# Patient Record
Sex: Male | Born: 1990 | Race: White | Hispanic: Yes | Marital: Single | Smoking: Former smoker
Health system: Southern US, Community
[De-identification: ages and names within clinical notes are randomized; demographics above are authoritative.]

---

## 2011-04-01 ENCOUNTER — Emergency Department (HOSPITAL_COMMUNITY)
Admission: EM | Admit: 2011-04-01 | Discharge: 2011-04-01 | Disposition: A | Discharge status: Home / Self Care | Payer: Self-pay | Attending: Emergency Medicine | Admitting: Emergency Medicine

## 2011-04-01 ENCOUNTER — Emergency Department (HOSPITAL_COMMUNITY): Payer: Self-pay

## 2011-04-01 DIAGNOSIS — R0609 Other forms of dyspnea: Secondary | ICD-10-CM | POA: Insufficient documentation

## 2011-04-01 DIAGNOSIS — R000 Tachycardia, unspecified: Secondary | ICD-10-CM | POA: Insufficient documentation

## 2011-04-01 DIAGNOSIS — R059 Cough, unspecified: Secondary | ICD-10-CM | POA: Insufficient documentation

## 2011-04-01 DIAGNOSIS — R0989 Other specified symptoms and signs involving the circulatory and respiratory systems: Secondary | ICD-10-CM | POA: Insufficient documentation

## 2011-04-01 DIAGNOSIS — R002 Palpitations: Secondary | ICD-10-CM | POA: Insufficient documentation

## 2011-04-01 DIAGNOSIS — R05 Cough: Secondary | ICD-10-CM | POA: Insufficient documentation

## 2011-04-15 ENCOUNTER — Emergency Department (HOSPITAL_COMMUNITY): Payer: Self-pay

## 2011-04-15 ENCOUNTER — Emergency Department (HOSPITAL_COMMUNITY)
Admission: EM | Admit: 2011-04-15 | Discharge: 2011-04-16 | Disposition: A | Discharge status: Home / Self Care | Payer: Self-pay | Attending: Emergency Medicine | Admitting: Emergency Medicine

## 2011-04-15 DIAGNOSIS — R1310 Dysphagia, unspecified: Secondary | ICD-10-CM | POA: Insufficient documentation

## 2012-04-07 ENCOUNTER — Emergency Department (HOSPITAL_COMMUNITY)
Admission: EM | Admit: 2012-04-07 | Discharge: 2012-04-07 | Disposition: A | Discharge status: Home / Self Care | Payer: Self-pay | Attending: Emergency Medicine | Admitting: Emergency Medicine

## 2012-04-07 ENCOUNTER — Encounter (HOSPITAL_COMMUNITY): Payer: Self-pay

## 2012-04-07 DIAGNOSIS — K219 Gastro-esophageal reflux disease without esophagitis: Secondary | ICD-10-CM | POA: Insufficient documentation

## 2012-04-07 MED ORDER — GI COCKTAIL ~~LOC~~
30.0000 mL | Freq: Once | ORAL | Status: AC
  Administered 2012-04-07: 30 mL via ORAL
  Filled 2012-04-07: qty 30

## 2012-04-07 MED ORDER — FAMOTIDINE 20 MG PO TABS: 20.0000 mg | ORAL_TABLET | Freq: Two times a day (BID) | ORAL | Status: DC

## 2012-04-07 MED ORDER — PANTOPRAZOLE SODIUM 40 MG PO TBEC
40.0000 mg | DELAYED_RELEASE_TABLET | Freq: Every day | ORAL | Status: DC
  Administered 2012-04-07: 40 mg via ORAL
  Filled 2012-04-07: qty 1

## 2012-04-07 MED ORDER — SUCRALFATE 1 G PO TABS: 1.0000 g | ORAL_TABLET | Freq: Four times a day (QID) | ORAL | Status: DC

## 2012-04-07 NOTE — ED Notes (Signed)
Pt c/o upper epigastric burning and sore throat after going to bed last pm, states ate prior to bed

## 2012-04-07 NOTE — ED Provider Notes (Signed)
History     CSN: [id number]  Arrival date & time 04/07/12  0503   First MD Initiated Contact with Patient 04/07/12 [phone]      Chief Complaint  Patient presents with  . Abdominal Pain    upper epigastric burning    (Consider location/radiation/quality/duration/timing/severity/associated sxs/prior treatment) Patient is a 21 y.o. male presenting with abdominal pain. The history is provided by the patient.  Abdominal Pain The primary symptoms of the illness include abdominal pain.   patient here complaining of epigastric pain after eating spicy food last night. No vomiting or bloody stools or black stools. Denies any fever. Pain is burning and radiates from his epigastric area food to his mouth. Notes a bitter taste in his mouth. History of same. No medications taken for this prior to arrival. Denies any dyspnea or chest pain  History reviewed. No pertinent past medical history.  History reviewed. No pertinent past surgical history.  No family history on file.  History  Substance Use Topics  . Smoking status: Not on file  . Smokeless tobacco: Not on file  . Alcohol Use: No      Review of Systems  Gastrointestinal: Positive for abdominal pain.  All other systems reviewed and are negative.    Allergies  Review of patient's allergies indicates no known allergies.  Home Medications  No current outpatient prescriptions on file.  BP 122/86  Pulse 62  Temp 98.7 F (37.1 C) (Oral)  Resp 12  SpO2 100%  Physical Exam  Nursing note and vitals reviewed. Constitutional: He is oriented to person, place, and time. He appears well-developed and well-nourished.  Non-toxic appearance. No distress.  HENT:  Head: Normocephalic and atraumatic.  Eyes: Conjunctivae normal, EOM and lids are normal. Pupils are equal, round, and reactive to light.  Neck: Normal range of motion. Neck supple. No tracheal deviation present. No mass present.  Cardiovascular: Normal rate, regular rhythm and  normal heart sounds.  Exam reveals no gallop.   No murmur heard. Pulmonary/Chest: Effort normal and breath sounds normal. No stridor. No respiratory distress. He has no decreased breath sounds. He has no wheezes. He has no rhonchi. He has no rales.  Abdominal: Soft. Normal appearance and bowel sounds are normal. He exhibits no distension. There is tenderness in the epigastric area. There is no rigidity, no rebound, no guarding and no CVA tenderness.  Musculoskeletal: Normal range of motion. He exhibits no edema and no tenderness.  Neurological: He is alert and oriented to person, place, and time. He has normal strength. No cranial nerve deficit or sensory deficit. GCS eye subscore is 4. GCS verbal subscore is 5. GCS motor subscore is 6.  Skin: Skin is warm and dry. No abrasion and no rash noted.  Psychiatric: He has a normal mood and affect. His speech is normal and behavior is normal.    ED Course  Procedures (including critical care time)  Labs Reviewed - No data to display No results found.   No diagnosis found.    MDM   Pt to be tx for gerd.       Toy Baker, MD 04/07/12 [phone]

## 2014-09-10 DIAGNOSIS — R5383 Other fatigue: Secondary | ICD-10-CM | POA: Insufficient documentation

## 2014-09-25 ENCOUNTER — Ambulatory Visit (INDEPENDENT_AMBULATORY_CARE_PROVIDER_SITE_OTHER): Payer: Self-pay | Admitting: Cardiology

## 2014-09-25 ENCOUNTER — Encounter: Payer: Self-pay | Admitting: Cardiology

## 2014-09-25 VITALS — BP 122/70 | HR 58 | Ht 65.0 in | Wt 202.0 lb

## 2014-09-25 DIAGNOSIS — R1310 Dysphagia, unspecified: Secondary | ICD-10-CM

## 2014-09-25 DIAGNOSIS — R079 Chest pain, unspecified: Secondary | ICD-10-CM

## 2014-09-25 DIAGNOSIS — R002 Palpitations: Secondary | ICD-10-CM

## 2014-09-25 LAB — CBC
HCT: 44.8 % (ref 39.0–52.0)
Hemoglobin: 14 g/dL (ref 13.0–17.0)
MCHC: 31.2 g/dL (ref 30.0–36.0)
MCV: 70.7 fl — ABNORMAL LOW (ref 78.0–100.0)
Platelets: 329 10*3/uL (ref 150.0–400.0)
RBC: 6.34 Mil/uL — ABNORMAL HIGH (ref 4.22–5.81)
RDW: 14.7 % (ref 11.5–15.5)
WBC: 7.9 10*3/uL (ref 4.0–10.5)

## 2014-09-25 LAB — BASIC METABOLIC PANEL
BUN: 15 mg/dL (ref 6–23)
CO2: 26 mEq/L (ref 19–32)
Calcium: 10.2 mg/dL (ref 8.4–10.5)
Chloride: 104 mEq/L (ref 96–112)
Creatinine, Ser: 0.9 mg/dL (ref 0.40–1.50)
GFR: 109.09 mL/min (ref >60.00)
Glucose, Bld: 88 mg/dL (ref 70–99)
Potassium: 4.1 mEq/L (ref 3.5–5.1)
Sodium: 137 mEq/L (ref 135–145)

## 2014-09-25 LAB — HEPATIC FUNCTION PANEL
ALT: 24 U/L (ref 0–53)
AST: 24 U/L (ref 0–37)
Albumin: 5 g/dL (ref 3.5–5.2)
Alkaline Phosphatase: 47 U/L (ref 39–117)
Bilirubin, Direct: 0.1 mg/dL (ref 0.0–0.3)
Total Bilirubin: 0.8 mg/dL (ref 0.2–1.2)
Total Protein: 8.4 g/dL — ABNORMAL HIGH (ref 6.0–8.3)

## 2014-09-25 LAB — TSH: TSH: 0.72 u[IU]/mL (ref 0.35–4.50)

## 2014-09-25 LAB — LIPID PANEL
Cholesterol: 147 mg/dL (ref 0–200)
HDL: 39.6 mg/dL (ref >39.00)
LDL Cholesterol: 78 mg/dL (ref 0–99)
NonHDL: 107.4
Total CHOL/HDL Ratio: 4
Triglycerides: 149 mg/dL (ref 0.0–149.0)
VLDL: 29.8 mg/dL (ref 0.0–40.0)

## 2014-09-25 NOTE — Progress Notes (Signed)
Patient ID: Avenir Lozinski, male   DOB: 27-Jan-1991, 24 y.o.   MRN: [id number]     Patient Name: Jeffery Barnett Date of Encounter: 09/25/2014  Primary Care Provider:  No primary care provider on file. Primary Cardiologist:  Lars Masson   Problem List   History reviewed. No pertinent past medical history. History reviewed. No pertinent past surgical history.  Allergies  No Known Allergies  HPI  A very pleasant 24 year old gentleman who is coming with complaint of chest pain, palpitations, dysphagia and odynophagia. The patient states that about 3 years ago he started to have exertional chest pains that are associated with shortness of breath and sometimes dizziness. He's also experiencing palpitations that can last up to 15 minutes and can happen as frequently as 3 times a week and they're associated with dizziness and nausea. He is also experiencing epigastric and left upper quadrant pain that persist at rest as well. He states that food intake sometimes makes it worse but not all the time. The patient also complains of constant fullness in his throat and difficulty swallowing he can easily choke a larger pieces of meals would lead him to eat very small food, despite these he has difficulty swallowing and experiences pain with that. He denies any syncope or lower extremity edema orthopnea or proximal nocturnal dyspnea. He is no family history of premature coronary artery disease or heart failure. As a child he was never told that he has any murmur or other heart problem.  Home Medications  Prior to Admission medications   Not on File    Family History  Family History  Problem Relation Age of Onset  . Anemia Neg Hx   . Arrhythmia Neg Hx   . Asthma Neg Hx   . Clotting disorder Neg Hx   . Fainting Neg Hx   . Heart attack Neg Hx   . Heart disease Neg Hx   . Heart failure Neg Hx   . Hyperlipidemia Neg Hx   . Hypertension Neg Hx     Social History  History   Social History    . Marital Status: Single    Spouse Name: N/A  . Number of Children: N/A  . Years of Education: N/A   Occupational History  . Not on file.   Social History Main Topics  . Smoking status: Never Smoker   . Smokeless tobacco: Never Used  . Alcohol Use: No  . Drug Use: No  . Sexual Activity: Not on file   Other Topics Concern  . Not on file   Social History Narrative     Review of Systems, as per HPI, otherwise negative General:  No chills, fever, night sweats or weight changes.  Cardiovascular:  No chest pain, dyspnea on exertion, edema, orthopnea, palpitations, paroxysmal nocturnal dyspnea. Dermatological: No rash, lesions/masses Respiratory: No cough, dyspnea Urologic: No hematuria, dysuria Abdominal:   No nausea, vomiting, diarrhea, bright red blood per rectum, melena, or hematemesis Neurologic:  No visual changes, wkns, changes in mental status. All other systems reviewed and are otherwise negative except as noted above.  Physical Exam  Blood pressure 122/70, pulse 58, height  (1.651 m), weight 202 lb (91.627 kg).  General: Pleasant, NAD Psych: Normal affect. Neuro: Alert and oriented X 3. Moves all extremities spontaneously. HEENT: Normal  Neck: Supple without bruits or JVD. Lungs:  Resp regular and unlabored, CTA. Heart: RRR no s3, s4, or murmurs. Abdomen: Soft, non-tender, non-distended, BS + x 4.  Extremities: No clubbing,  cyanosis or edema. DP/PT/Radials 2+ and equal bilaterally.  Labs:  No results for input(s): CKTOTAL, CKMB, TROPONINI in the last 72 hours. No results found for: WBC, HGB, HCT, MCV, PLT  No results found for: DDIMER Invalid input(s): POCBNP No results found for: NA, K, CL, CO2, GLUCOSE, BUN, CREATININE, CALCIUM, PROT, ALBUMIN, AST, ALT, ALKPHOS, BILITOT, GFRNONAA, GFRAA No results found for: CHOL, HDL, LDLCALC, TRIG  Accessory Clinical Findings  echocardiogram  ECG - sinus bradycardia, incomplete right bundle branch block,  rightward axis.    Assessment & Plan  24 year old gentleman  1. Exertional chest pain  - with some typical and some atypical features, we will schedule a treadmill stress test to rule out ischemia. We will also order an echocardiogram to evaluate for any possible structural heart disease.  2. Palpitations associated nausea and dizziness - start 48 hour Holter monitor, check compressive metabolic profile BC and TSH.  3. Dysphagia and odynophagia epigastric pain, patient was started on Nexium without any relief. His presentation is suspicious for upper GI abnormalities such as Schatzki's ring we will refer to GI for further evaluation.  Follow up in one month.   Lars MassonNELSON, Trysten Bernard H, MD, [facility] 09/25/2014, 10:11 AM

## 2014-09-25 NOTE — Patient Instructions (Signed)
Your physician recommends that you have lab work today: BMP, CBC, TSH, LIVER and LIPIDS  Your physician has requested that you have an echocardiogram. Echocardiography is a painless test that uses sound waves to create images of your heart. It provides your doctor with information about the size and shape of your heart and how well your heart's chambers and valves are working. This procedure takes approximately one hour. There are no restrictions for this procedure.  Your physician has requested that you have an exercise tolerance test. For further information please visit [email or web address]. Please also follow instruction sheet, as given.  Your physician has recommended that you wear a  48 hour holter monitor. Holter monitors are medical devices that record the heart's electrical activity. Doctors most often use these monitors to diagnose arrhythmias. Arrhythmias are problems with the speed or rhythm of the heartbeat. The monitor is a small, portable device. You can wear one while you do your normal daily activities. This is usually used to diagnose what is causing palpitations/syncope (passing out).  You have been referred to Gastroenterology for difficulty swallowing.   Your physician recommends that you schedule a follow-up appointment in: 1 MONTH with Dr Delton SeeNelson

## 2014-10-05 ENCOUNTER — Encounter: Payer: Self-pay | Admitting: *Deleted

## 2014-10-05 ENCOUNTER — Other Ambulatory Visit (HOSPITAL_COMMUNITY): Payer: Self-pay

## 2014-10-05 ENCOUNTER — Ambulatory Visit (HOSPITAL_COMMUNITY): Payer: PRIVATE HEALTH INSURANCE | Attending: Cardiology | Admitting: Radiology

## 2014-10-05 ENCOUNTER — Encounter (INDEPENDENT_AMBULATORY_CARE_PROVIDER_SITE_OTHER): Payer: PRIVATE HEALTH INSURANCE

## 2014-10-05 DIAGNOSIS — E669 Obesity, unspecified: Secondary | ICD-10-CM | POA: Insufficient documentation

## 2014-10-05 DIAGNOSIS — R002 Palpitations: Secondary | ICD-10-CM

## 2014-10-05 DIAGNOSIS — R079 Chest pain, unspecified: Secondary | ICD-10-CM

## 2014-10-05 NOTE — Progress Notes (Signed)
Echocardiogram performed.  

## 2014-10-05 NOTE — Progress Notes (Signed)
Patient ID: Jeffery Barnett, male   DOB: 11/23/1990, 24 y.o.   MRN: [id number] [employer] 48 hour holter monitor applied to patient.

## 2014-10-09 ENCOUNTER — Ambulatory Visit: Payer: Self-pay | Admitting: Physician Assistant

## 2014-10-12 ENCOUNTER — Telehealth: Payer: Self-pay | Admitting: *Deleted

## 2014-10-12 NOTE — Telephone Encounter (Signed)
Pt notified of normal 24 hour holter monitor results per Dr Nelson.  Pt verbalized understanding. 

## 2014-10-18 ENCOUNTER — Encounter: Payer: Self-pay | Admitting: Gastroenterology

## 2014-10-18 ENCOUNTER — Ambulatory Visit (INDEPENDENT_AMBULATORY_CARE_PROVIDER_SITE_OTHER): Payer: PRIVATE HEALTH INSURANCE | Admitting: Gastroenterology

## 2014-10-18 VITALS — BP 128/70 | HR 76 | Ht 65.0 in | Wt 200.6 lb

## 2014-10-18 DIAGNOSIS — K219 Gastro-esophageal reflux disease without esophagitis: Secondary | ICD-10-CM | POA: Diagnosis not present

## 2014-10-18 DIAGNOSIS — R1314 Dysphagia, pharyngoesophageal phase: Secondary | ICD-10-CM | POA: Diagnosis not present

## 2014-10-18 MED ORDER — PANTOPRAZOLE SODIUM 40 MG PO TBEC: 40.0000 mg | DELAYED_RELEASE_TABLET | Freq: Every day | ORAL | Status: DC

## 2014-10-18 NOTE — Progress Notes (Signed)
     10/18/2014 Jeffery Barnett [id number] 05/10/1991   HISTORY OF PRESENT ILLNESS:  This is a pleasant 24 year old male who his new to our practice.  He presents to our office today with complaints of dysphagia/difficulty swallowing.  This has been occuring intermittently over the past 4 years or so, but worse again recently.  He says that when he eats sometimes it feels like it gets stuck in his "throat"; he point on the left side of his neck.  This has been occuring again over the past 6-12 months so has finally come for evaluation.  He admits to some nausea at times and some heartburn/reflux as well.  Took some OTC PPI for a short time.  He was in the ED in 2012 for this issue and had a neck/soft tissue x-ray done at that time, which was negative, and esphagram was recommended to follow-up on his symptoms, but he never had that performed.  Recent CBC, TSH, BMP, and hepatic function panel were normal.   History reviewed. No pertinent past medical history. History reviewed. No pertinent past surgical history.  reports that he has quit smoking. He has never used smokeless tobacco. He reports that he drinks alcohol. He reports that he does not use illicit drugs. family history is negative for Anemia, Arrhythmia, Asthma, Clotting disorder, Fainting, Heart attack, Heart disease, Heart failure, Hyperlipidemia, and Hypertension. No Known Allergies    Outpatient Encounter Prescriptions as of 10/18/2014  Medication Sig  . pantoprazole (PROTONIX) 40 MG tablet Take 1 tablet (40 mg total) by mouth daily.     REVIEW OF SYSTEMS  : All other systems reviewed and negative except where noted in the History of Present Illness.   PHYSICAL EXAM: BP 128/70 mmHg  Pulse 76  Ht 5\' 5"  (1.651 m)  Wt 200 lb 9.6 oz (90.992 kg)  BMI 33.38 kg/m2 General: Well developed male in no acute distress Head: Normocephalic and atraumatic Eyes:  Sclerae anicteric, conjunctiva pink. Ears: Normal auditory acuity Lungs:  Clear throughout to auscultation Heart: Regular rate and rhythm Abdomen: Soft, non-distended.  Normal bowel sounds.  Non-tender. Musculoskeletal: Symmetrical with no gross deformities  Skin: No lesions on visible extremities Extremities: No edema  Neurological: Alert oriented x 4, grossly non-focal Psychological:  Alert and cooperative. Normal mood and affect  ASSESSMENT AND PLAN: -Dysphagia:  Intermittent over the past 4 years, but worse again recently.  Does not sound classically like a stricture/Shatzki's ring.  ? Zenker's diverticulum vs other etiology as well.  Will start by checking an esophagram.  Will also start him on pantoprazole 40 mg daily for reflux/GERD related cause of his symptoms.  CC:  No ref. provider found

## 2014-10-18 NOTE — Patient Instructions (Signed)
You have [facility] on 11/01/14 at 11 am. Please arrive 15 minutes prior to your appointment for registration. Make certain not to have anything to eat or drink 6 hours prior to your test. If you need to reschedule for any reason, please contact radiology at [phone] to do so. __________________________________________________________________ A barium swallow is an examination that concentrates on views of the esophagus. This tends to be a double contrast exam (barium and two liquids which, when combined, create a gas to distend the wall of the oesophagus) or single contrast (non-ionic iodine based). The study is usually tailored to your symptoms so a good history is essential. Attention is paid during the study to the form, structure and configuration of the esophagus, looking for functional disorders (such as aspiration, dysphagia, achalasia, motility and reflux) EXAMINATION You may be asked to change into a gown, depending on the type of swallow being performed. A radiologist and radiographer will perform the procedure. The radiologist will advise you of the type of contrast selected for your procedure and direct you during the exam. You will be asked to stand, sit or lie in several different positions and to hold a small amount of fluid in your mouth before being asked to swallow while the imaging is performed .In some instances you may be asked to swallow barium coated marshmallows to assess the motility of a solid food bolus. The exam can be recorded as a digital or video fluoroscopy procedure. POST PROCEDURE It will take 1-2 days for the barium to pass through your system. To facilitate this, it is important, unless otherwise directed, to increase your fluids for the next 24-48hrs and to resume your normal diet.  This test typically takes about 30 minutes to  perform. __________________________________________________________________________________ We have sent medications to your pharmacy for you to pick up at your convenience.

## 2014-10-18 NOTE — Progress Notes (Signed)
Reviewed and agree with management plan.  Malcolm T. Stark, MD FACG 

## 2014-11-01 ENCOUNTER — Ambulatory Visit (HOSPITAL_COMMUNITY)
Admission: RE | Admit: 2014-11-01 | Discharge: 2014-11-01 | Disposition: A | Discharge status: Home / Self Care | Payer: PRIVATE HEALTH INSURANCE | Source: Ambulatory Visit | Attending: Gastroenterology | Admitting: Gastroenterology

## 2014-11-01 DIAGNOSIS — R1314 Dysphagia, pharyngoesophageal phase: Secondary | ICD-10-CM

## 2014-11-01 DIAGNOSIS — R1310 Dysphagia, unspecified: Secondary | ICD-10-CM | POA: Insufficient documentation

## 2014-11-06 ENCOUNTER — Encounter: Payer: Self-pay | Admitting: Physician Assistant

## 2014-11-06 ENCOUNTER — Encounter: Payer: Self-pay | Admitting: *Deleted

## 2014-11-06 NOTE — Progress Notes (Signed)
Patient ID: Jeffery Barnett, male   DOB: 1990/10/12, 24 y.o.   MRN: [id number] Patient did not show up for 11/06/14 2:00 PM appointment for his GXT.

## 2014-11-07 ENCOUNTER — Ambulatory Visit: Payer: Self-pay | Admitting: Cardiology

## 2014-11-08 ENCOUNTER — Encounter: Payer: Self-pay | Admitting: Cardiology

## 2014-11-27 ENCOUNTER — Ambulatory Visit: Payer: Self-pay | Admitting: Cardiology

## 2014-12-07 ENCOUNTER — Encounter: Payer: Self-pay | Admitting: Cardiology

## 2018-02-13 ENCOUNTER — Emergency Department (HOSPITAL_COMMUNITY): Payer: 59

## 2018-02-13 ENCOUNTER — Other Ambulatory Visit: Payer: Self-pay

## 2018-02-13 ENCOUNTER — Emergency Department (HOSPITAL_COMMUNITY)
Admission: EM | Admit: 2018-02-13 | Discharge: 2018-02-13 | Disposition: A | Discharge status: Home / Self Care | Payer: 59 | Attending: Emergency Medicine | Admitting: Emergency Medicine

## 2018-02-13 ENCOUNTER — Encounter (HOSPITAL_COMMUNITY): Payer: Self-pay

## 2018-02-13 DIAGNOSIS — R0789 Other chest pain: Secondary | ICD-10-CM | POA: Insufficient documentation

## 2018-02-13 DIAGNOSIS — R079 Chest pain, unspecified: Secondary | ICD-10-CM | POA: Diagnosis present

## 2018-02-13 DIAGNOSIS — Z87891 Personal history of nicotine dependence: Secondary | ICD-10-CM | POA: Insufficient documentation

## 2018-02-13 DIAGNOSIS — Z79899 Other long term (current) drug therapy: Secondary | ICD-10-CM | POA: Insufficient documentation

## 2018-02-13 LAB — CBC
HCT: 44.3 % (ref 39.0–52.0)
HEMOGLOBIN: 14.2 g/dL (ref 13.0–17.0)
MCH: 22.9 pg — ABNORMAL LOW (ref 26.0–34.0)
MCHC: 32.1 g/dL (ref 30.0–36.0)
MCV: 71.6 fL — ABNORMAL LOW (ref 78.0–100.0)
Platelets: 342 10*3/uL (ref 150–400)
RBC: 6.19 MIL/uL — AB (ref 4.22–5.81)
RDW: 14.2 % (ref 11.5–15.5)
WBC: 8.2 10*3/uL (ref 4.0–10.5)

## 2018-02-13 LAB — BASIC METABOLIC PANEL
ANION GAP: 8 (ref 5–15)
BUN: 14 mg/dL (ref 6–20)
CO2: 28 mmol/L (ref 22–32)
Calcium: 9.9 mg/dL (ref 8.9–10.3)
Chloride: 107 mmol/L (ref 98–111)
Creatinine, Ser: 0.8 mg/dL (ref 0.61–1.24)
Glucose, Bld: 99 mg/dL (ref 70–99)
Potassium: 4 mmol/L (ref 3.5–5.1)
Sodium: 143 mmol/L (ref 135–145)

## 2018-02-13 LAB — I-STAT TROPONIN, ED: TROPONIN I, POC: 0 ng/mL (ref 0.00–0.08)

## 2018-02-13 MED ORDER — PANTOPRAZOLE SODIUM 20 MG PO TBEC: 20.0000 mg | DELAYED_RELEASE_TABLET | Freq: Every day | ORAL | 1 refills | Status: AC

## 2018-02-13 NOTE — ED Notes (Signed)
Patient verbalized understanding of discharge instructions, no questions. Patient ambulated out of ED with steady gait in no distress.  

## 2018-02-13 NOTE — ED Triage Notes (Signed)
Pt states that he has been having chest tightness, lightheadedness, and generalized weakness. Pt ambulatory in triage. [facility], pt drove self.

## 2018-02-13 NOTE — Discharge Instructions (Signed)
Start back taking the Protonix once a day.  Follow-up with family doctor next week to see how your symptoms are and to recheck your blood pressure.

## 2018-02-13 NOTE — ED Provider Notes (Signed)
[facility]-EMERGENCY DEPT Provider Note   CSN: [id number] Arrival date & time: 02/13/18  1043     History   Chief Complaint Chief Complaint  Patient presents with  . Chest Pain    HPI Jeffery Barnett is a 27 y.o. male.  Patient states that he had some chest discomfort today.  He usually takes his Protonix but has not had a in 3 days  The history is provided by the patient. No language interpreter was used.  Chest Pain   This is a recurrent problem. The current episode started 2 days ago. The problem occurs rarely. The problem has been resolved. The pain is associated with rest. The pain is present in the substernal region. The pain is at a severity of 4/10. The pain is moderate. The quality of the pain is described as burning. The pain does not radiate. Pertinent negatives include no abdominal pain, no back pain, no cough and no headaches.  Pertinent negatives for past medical history include no seizures.    History reviewed. No pertinent past medical history.  Patient Active Problem List   Diagnosis Date Noted  . Dysphagia, pharyngoesophageal phase 10/18/2014  . Esophageal reflux 10/18/2014  . Fatigue 09/10/2014    History reviewed. No pertinent surgical history.      Home Medications    Prior to Admission medications   Medication Sig Start Date End Date Taking? Authorizing Provider  omeprazole (PRILOSEC) 20 MG capsule Take 20 mg by mouth daily. 01/14/18  Yes [provider]  pantoprazole (PROTONIX) 40 MG tablet Take 1 tablet (40 mg total) by mouth daily. Patient not taking: Reported on 02/13/2018 10/18/14   Zehr, Princella Pellegrini, PA-C    Family History Family History  Problem Relation Age of Onset  . Anemia Neg Hx   . Arrhythmia Neg Hx   . Asthma Neg Hx   . Clotting disorder Neg Hx   . Fainting Neg Hx   . Heart attack Neg Hx   . Heart disease Neg Hx   . Heart failure Neg Hx   . Hyperlipidemia Neg Hx   . Hypertension Neg Hx     Social  History Social History   Tobacco Use  . Smoking status: Former Games developer  . Smokeless tobacco: Never Used  Substance Use Topics  . Alcohol use: Yes    Alcohol/week: 0.0 oz  . Drug use: No     Allergies   Patient has no known allergies.   Review of Systems Review of Systems  Constitutional: Negative for appetite change and fatigue.  HENT: Negative for congestion, ear discharge and sinus pressure.   Eyes: Negative for discharge.  Respiratory: Negative for cough.   Cardiovascular: Positive for chest pain.  Gastrointestinal: Negative for abdominal pain and diarrhea.  Genitourinary: Negative for frequency and hematuria.  Musculoskeletal: Negative for back pain.  Skin: Negative for rash.  Neurological: Negative for seizures and headaches.  Psychiatric/Behavioral: Negative for hallucinations.     Physical Exam Updated Vital Signs BP (!) 149/107   Pulse 67   Temp 98.7 F (37.1 C) (Oral)   Resp 11   Ht 5\' 5"  (1.651 m)   Wt 90.7 kg (200 lb)   SpO2 98%   BMI 33.28 kg/m   Physical Exam  Constitutional: He is oriented to person, place, and time. He appears well-developed.  HENT:  Head: Normocephalic.  Eyes: Conjunctivae and EOM are normal. No scleral icterus.  Neck: Neck supple. No thyromegaly present.  Cardiovascular: Normal rate and  regular rhythm. Exam reveals no gallop and no friction rub.  No murmur heard. Pulmonary/Chest: No stridor. He has no wheezes. He has no rales. He exhibits no tenderness.  Abdominal: He exhibits no distension. There is no tenderness. There is no rebound.  Musculoskeletal: Normal range of motion. He exhibits no edema.  Lymphadenopathy:    He has no cervical adenopathy.  Neurological: He is oriented to person, place, and time. He exhibits normal muscle tone. Coordination normal.  Skin: No rash noted. No erythema.  Psychiatric: He has a normal mood and affect. His behavior is normal.     ED Treatments / Results  Labs (all labs ordered are  listed, but only abnormal results are displayed) Labs Reviewed  CBC - Abnormal; Notable for the following components:      Result Value   RBC 6.19 (*)    MCV 71.6 (*)    MCH 22.9 (*)    All other components within normal limits  BASIC METABOLIC PANEL  I-STAT TROPONIN, ED    EKG EKG Interpretation  Date/Time:  Saturday February 13 2018 10:51:05 EDT Ventricular Rate:  71 PR Interval:    QRS Duration: 109 QT Interval:  378 QTC Calculation: 411 R Axis:   97 Text Interpretation:  Sinus rhythm Borderline right axis deviation Confirmed by Bethann BerkshireZammit, Leemon Ayala [phone]([id number]) on 02/13/2018 12:04:20 PM   Radiology Dg Chest 2 View  Result Date: 02/13/2018 CLINICAL DATA:  Left side chest pain on and off since Wednesday with some SOB EXAM: CHEST - 2 VIEW COMPARISON:  08/31/2014 FINDINGS: Normal mediastinum and cardiac silhouette. Normal pulmonary vasculature. No evidence of effusion, infiltrate, or pneumothorax. No acute bony abnormality. IMPRESSION: No acute cardiopulmonary process. Electronically Signed   By: Genevive BiStewart  Edmunds M.D.   On: 02/13/2018 11:27    Procedures Procedures (including critical care time)  Medications Ordered in ED Medications - No data to display   Initial Impression / Assessment and Plan / ED Course  I have reviewed the triage vital signs and the nursing notes.  Pertinent labs & imaging results that were available during my care of the patient were reviewed by me and considered in my medical decision making (see chart for details).     Labs EKG chest x-ray unremarkable.  Suspect GERD.  Patient will be placed back on his Protonix and will follow-up with the primary care  Final Clinical Impressions(s) / ED Diagnoses   Final diagnoses:  Atypical chest pain    ED Discharge Orders    None       Bethann BerkshireZammit, Steffanie Mingle, MD 02/14/18 1600

## 2019-12-30 IMAGING — CR DG CHEST 2V
2 series · 2 of 2 positions shown · non-contrast
Comparison: 08/31/2014

CLINICAL DATA: Left side chest pain on and off since [REDACTED] with
some SOB

EXAM:
CHEST - 2 VIEW

[w chest pa]
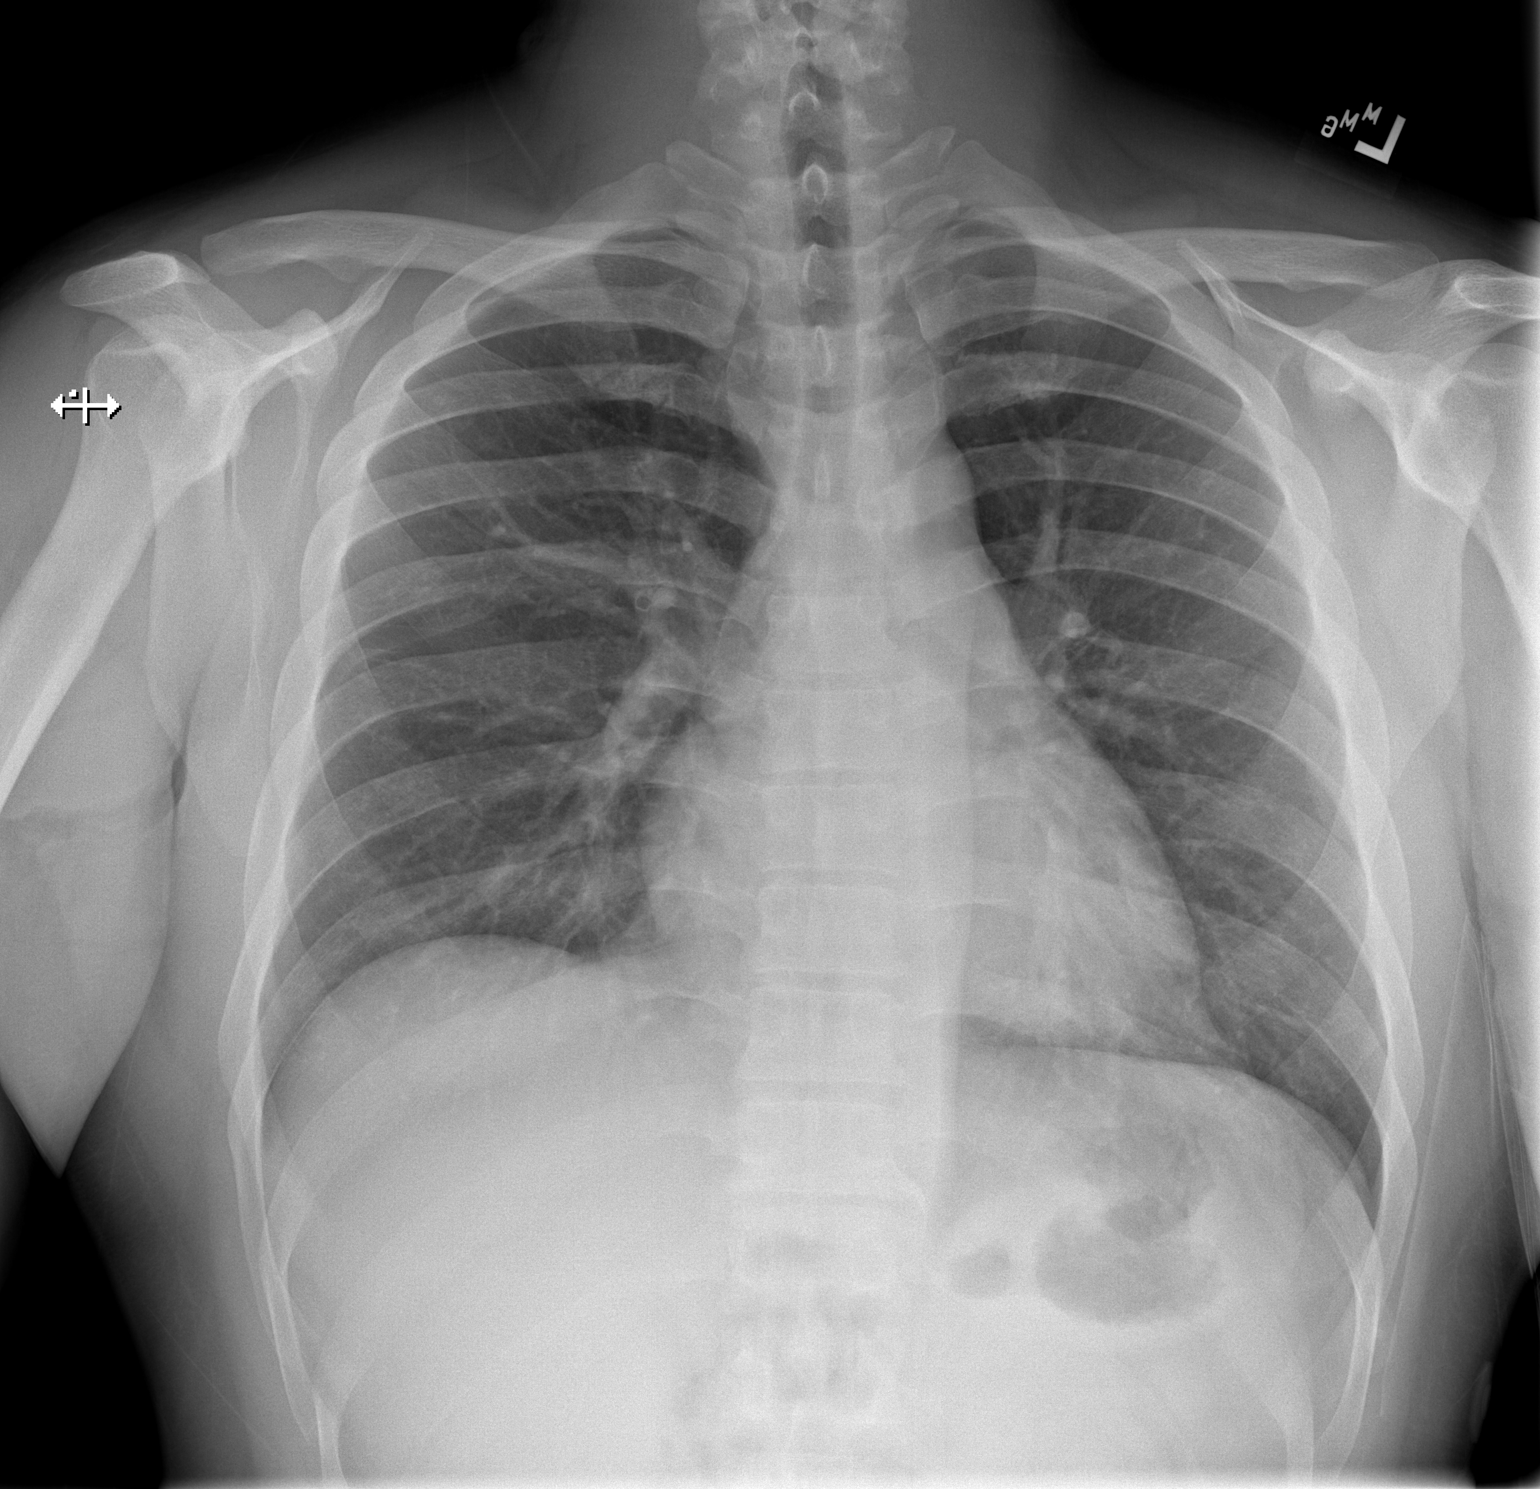

[w chest lat]
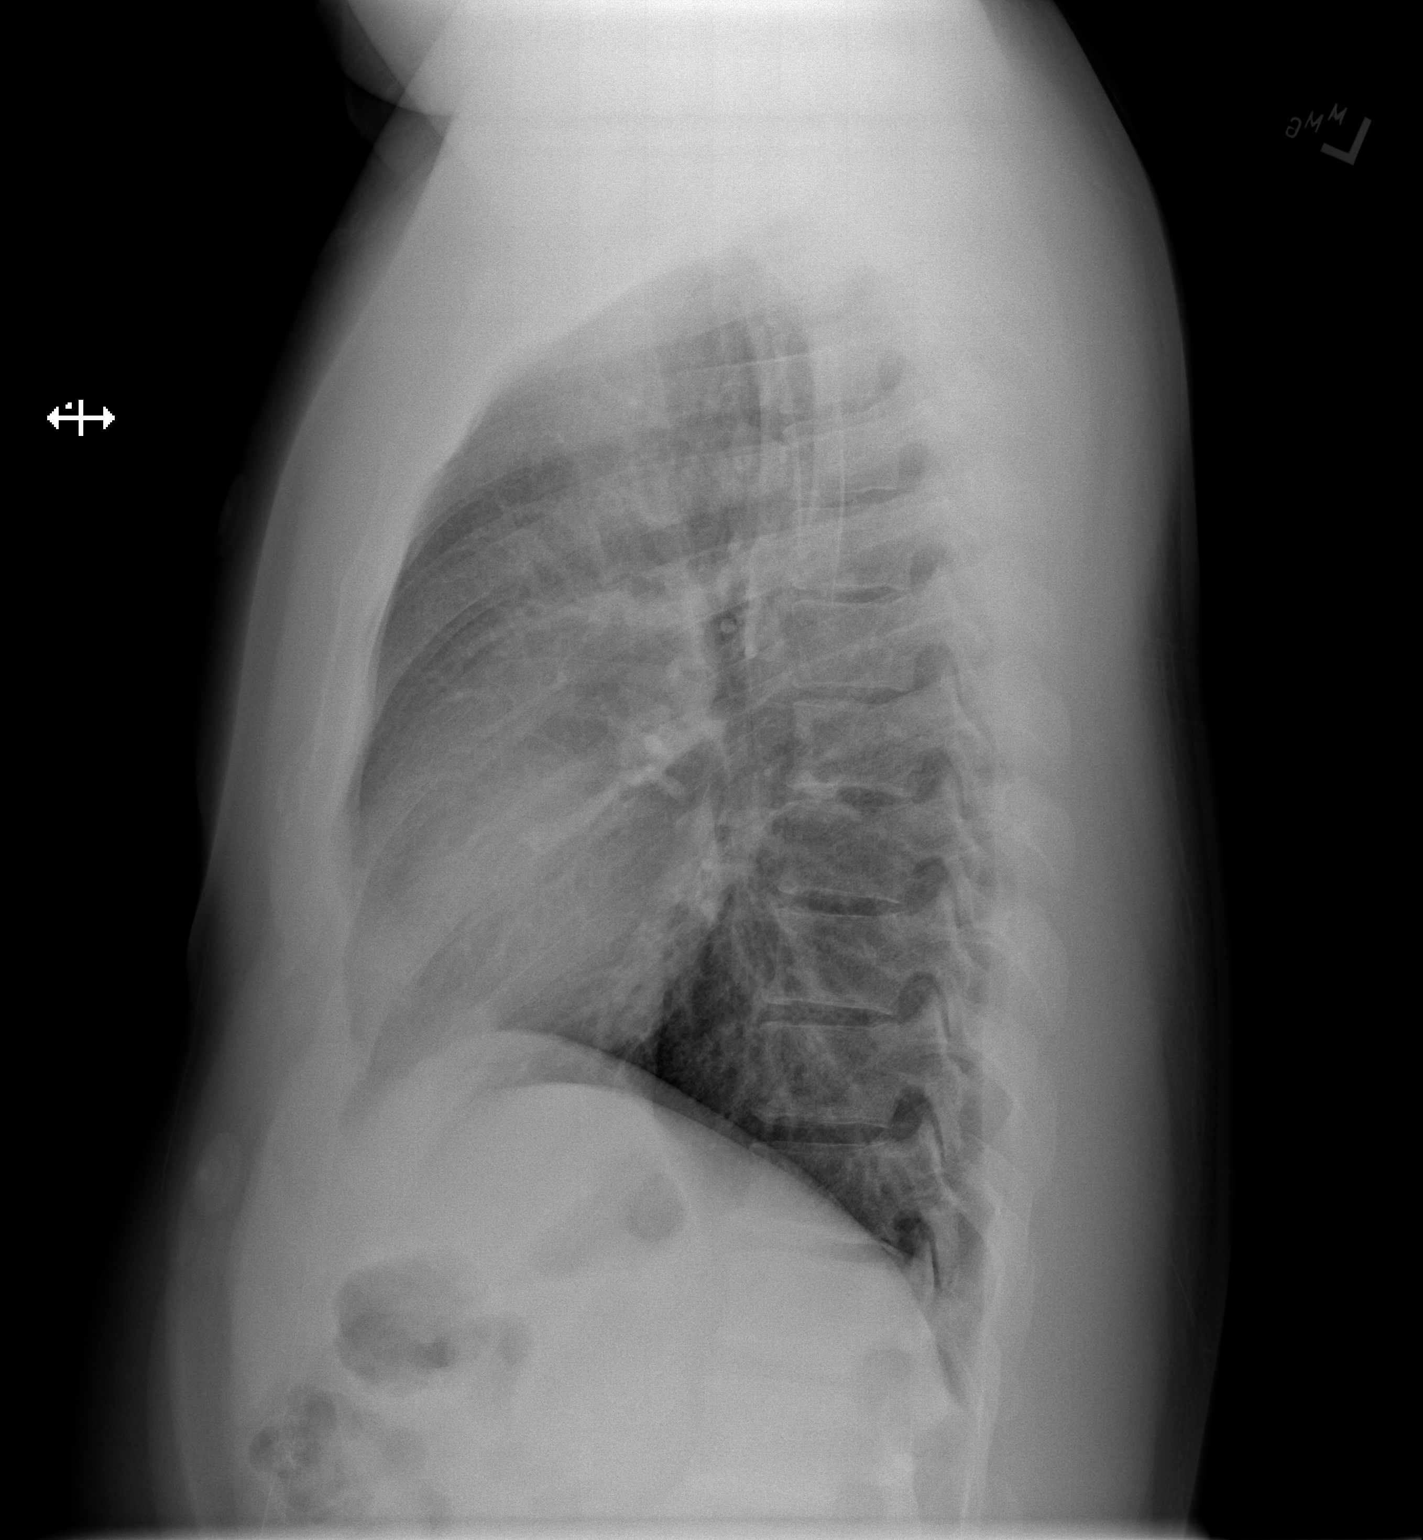

[2 of 2 positions shown; findings below may reference images not displayed]

FINDINGS: Normal mediastinum and cardiac silhouette. Normal pulmonary
vasculature. No evidence of effusion, infiltrate, or pneumothorax.
No acute bony abnormality.
IMPRESSION: No acute cardiopulmonary process.
